# Patient Record
Sex: Female | Born: 1985 | Race: White | Hispanic: No | Marital: Married | Smoking: Never smoker
Health system: Southern US, Community
[De-identification: ages and names within clinical notes are randomized; demographics above are authoritative.]

## PROBLEM LIST (undated history)

## (undated) DIAGNOSIS — I10 Essential (primary) hypertension: Secondary | ICD-10-CM

---

## 2014-09-02 ENCOUNTER — Encounter (HOSPITAL_BASED_OUTPATIENT_CLINIC_OR_DEPARTMENT_OTHER): Payer: Self-pay | Admitting: Emergency Medicine

## 2014-09-02 ENCOUNTER — Emergency Department (HOSPITAL_BASED_OUTPATIENT_CLINIC_OR_DEPARTMENT_OTHER)
Admission: EM | Admit: 2014-09-02 | Discharge: 2014-09-02 | Disposition: A | Discharge status: Home / Self Care | Payer: BLUE CROSS/BLUE SHIELD | Attending: Emergency Medicine | Admitting: Emergency Medicine

## 2014-09-02 ENCOUNTER — Emergency Department (HOSPITAL_BASED_OUTPATIENT_CLINIC_OR_DEPARTMENT_OTHER): Payer: BLUE CROSS/BLUE SHIELD

## 2014-09-02 DIAGNOSIS — S51812A Laceration without foreign body of left forearm, initial encounter: Secondary | ICD-10-CM | POA: Insufficient documentation

## 2014-09-02 DIAGNOSIS — Y288XXA Contact with other sharp object, undetermined intent, initial encounter: Secondary | ICD-10-CM | POA: Diagnosis not present

## 2014-09-02 DIAGNOSIS — IMO0002 Reserved for concepts with insufficient information to code with codable children: Secondary | ICD-10-CM

## 2014-09-02 DIAGNOSIS — Z792 Long term (current) use of antibiotics: Secondary | ICD-10-CM | POA: Diagnosis not present

## 2014-09-02 DIAGNOSIS — I10 Essential (primary) hypertension: Secondary | ICD-10-CM | POA: Diagnosis not present

## 2014-09-02 DIAGNOSIS — Y929 Unspecified place or not applicable: Secondary | ICD-10-CM | POA: Diagnosis not present

## 2014-09-02 DIAGNOSIS — Y9389 Activity, other specified: Secondary | ICD-10-CM | POA: Diagnosis not present

## 2014-09-02 DIAGNOSIS — Z79899 Other long term (current) drug therapy: Secondary | ICD-10-CM | POA: Diagnosis not present

## 2014-09-02 DIAGNOSIS — Y998 Other external cause status: Secondary | ICD-10-CM | POA: Diagnosis not present

## 2014-09-02 HISTORY — DX: Essential (primary) hypertension: I10

## 2014-09-02 MED ORDER — LIDOCAINE-EPINEPHRINE 2 %-1:100000 IJ SOLN
INTRAMUSCULAR | Status: AC
  Administered 2014-09-02: 1 mL
  Filled 2014-09-02: qty 1

## 2014-09-02 MED ORDER — CEPHALEXIN 250 MG PO CAPS
500.0000 mg | ORAL_CAPSULE | Freq: Once | ORAL | Status: AC
  Administered 2014-09-02: 500 mg via ORAL
  Filled 2014-09-02: qty 2

## 2014-09-02 MED ORDER — CEPHALEXIN 500 MG PO CAPS
500.0000 mg | ORAL_CAPSULE | Freq: Four times a day (QID) | ORAL | Status: AC
Start: 2014-09-02 — End: ?

## 2014-09-02 NOTE — ED Notes (Signed)
Pt presents to ED with complaints of lac to left inner forearm. Pt reports she accidentally cut it on a table saw around 9 pm last night but had been drinking so she did not want to drive here.

## 2014-09-02 NOTE — Discharge Instructions (Signed)
Keep wound dry and do not remove dressing for 24 hours if possible. After that, wash gently morning and night (every 12 hours) with soap and water. Use a topical antibiotic ointment and cover with a bandaid or gauze.  °  °Do NOT use rubbing alcohol or hydrogen peroxide, do not soak the area °  °Present to your primary care doctor or the urgent care of your choice, or the ED for suture removal in 7-10 days. °  °Every attempt was made to remove foreign body (contaminants) from the wound.  However, there is always a chance that some may remain in the wound. This can  increase your risk of infection. °  °If you see signs of infection (warmth, redness, tenderness, pus, sharp increase in pain, fever, red streaking in the skin) immediately return to the emergency department. °  °After the wound heals fully, apply sunscreen for 6-12 months to minimize scarring.  ° °

## 2014-09-02 NOTE — ED Provider Notes (Signed)
CSN: [id number]     Arrival date & time 09/02/14  1227 History   First MD Initiated Contact with Patient 09/02/14 1348     Chief Complaint  Patient presents with  . Laceration     (Consider location/radiation/quality/duration/timing/severity/associated sxs/prior Treatment) HPI  Ana Poole is a 29 y.o. female complaining of laceration to left volar forearm onset at 9 PM last night. Patient says she was reaching over for a operating circular saw for a piece of wood accidentally cut herself. States her last tetanus shot was within the last 3 years. Patient states she does not present to the ED asked night because her and her husband had been drinking alcohol and they felt that it was unsafe to drive. She denies numbness, weakness, decreased range of motion. She states she is right-hand dominant.   Past Medical History  Diagnosis Date  . Hypertension    History reviewed. No pertinent past surgical history. No family history on file. History  Substance Use Topics  . Smoking status: Never Smoker   . Smokeless tobacco: Not on file  . Alcohol Use: Not on file   OB History    No data available     Review of Systems  10 systems reviewed and found to be negative, except as noted in the HPI.   Allergies  Sulfa antibiotics  Home Medications   Prior to Admission medications   Medication Sig Start Date End Date Taking? Authorizing Provider  lisinopril (PRINIVIL,ZESTRIL) 10 MG tablet Take 10 mg by mouth daily.   Yes Historical Provider, MD  cephALEXin (KEFLEX) 500 MG capsule Take 1 capsule (500 mg total) by mouth 4 (four) times daily. 09/02/14   Darrien Laakso, PA-C   BP 134/86 mmHg  Pulse 94  Temp(Src) 98.7 F (37.1 C) (Oral)  Resp 16  Ht 5\' 5"  (1.651 m)  Wt 155 lb (70.308 kg)  BMI 25.79 kg/m2  SpO2 100%  LMP 08/29/2014 Physical Exam  Musculoskeletal:       Arms: 7 cm full-thickness gaping laceration to left volar forearm. Bleeding is controlled, no gross  contamination, patient has full range of motion in flexion of every joint. Patient is intact to pinprick and light touch 5 digits.    ED Course  LACERATION REPAIR Date/Time: 09/02/2014 2:28 PM Performed by: Wynetta EmeryPISCIOTTA, Deyci Gesell Authorized by: Wynetta EmeryPISCIOTTA, Janesha Brissette Consent: Verbal consent obtained. Consent given by: patient Patient identity confirmed: verbally with patient Body area: upper extremity Location details: left lower arm Laceration length: 7 cm Foreign bodies: no foreign bodies Tendon involvement: none Nerve involvement: none Vascular damage: no Anesthesia: local infiltration Local anesthetic: lidocaine 2% with epinephrine Preparation: Patient was prepped and draped in the usual sterile fashion. Irrigation method: syringe Amount of cleaning: extensive Debridement: none Degree of undermining: none Skin closure: Ethilon (4-0) Number of sutures: 6 Technique: running Approximation: loose Approximation difficulty: simple Dressing: antibiotic ointment, non-adhesive packing strip and gauze roll Patient tolerance: Patient tolerated the procedure well with no immediate complications   (including critical care time) Labs Review Labs Reviewed - No data to display  Imaging Review Dg Forearm Left  09/02/2014   CLINICAL DATA:  Laceration from table saw injury.  EXAM: LEFT FOREARM - 2 VIEW  COMPARISON:  None.  FINDINGS: Frontal and lateral views were obtained. There is no demonstrable fracture or dislocation. Joint spaces appear intact. 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 There is no radiopaque foreign body.  IMPRESSION: No fracture or dislocation.  No  radiopaque foreign body.   Electronically Signed   By: Bretta Bang M.D.   On: 09/02/2014 14:29     EKG Interpretation None      MDM   Final diagnoses:  Laceration    Filed Vitals:   09/02/14 1238 09/02/14 1446  BP: 134/89 134/86  Pulse: 101 94  Temp: 98.7 F (37.1 C) 98.7 F  (37.1 C)  TempSrc: Oral Oral  Resp: 18 16  Height:  (1.651 m)   Weight: 155 lb (70.308 kg)   SpO2: 97% 100%    Medications  cephALEXin (KEFLEX) capsule 500 mg (500 mg Oral Given 09/02/14 1408)  lidocaine-EPINEPHrine (XYLOCAINE W/EPI) 2 %-1:100000 (with pres) injection (1 mL  Given by Other 09/02/14 1430)    Ana Poole is a pleasant 29 y.o. female presenting with gaping laceration to left forearm, there is no tendon or nerve damage. She is out of the 12 hour window for closure however she is young and healthy and this will leave a large scar. Wound is closed loosely, she started on antibiotics, we have had an extensive discussion of wound care and return precautions for infection. I've advised her that if she would like to have scar revision she can see a Engineer, petroleum after the wound fully [facility] in 6 months.  Evaluation does not show pathology that would require ongoing emergent intervention or inpatient treatment. Pt is hemodynamically stable and mentating appropriately. Discussed findings and plan with patient/guardian, who agrees with care plan. All questions answered. Return precautions discussed and outpatient follow up given.   Discharge Medication List as of 09/02/2014  2:35 PM    START taking these medications   Details  cephALEXin (KEFLEX) 500 MG capsule Take 1 capsule (500 mg total) by mouth 4 (four) times daily., Starting 09/02/2014, Until Discontinued, Print             Wynetta Emery, PA-C 09/02/14 1623  Hilario Quarry, MD 09/05/14 2150

## 2015-12-20 IMAGING — CR DG FOREARM 2V*L*
2 series · 2 of 2 positions shown · non-contrast
Comparison: None.

CLINICAL DATA: Laceration from table saw injury.

EXAM:
LEFT FOREARM - 2 VIEW

[x forearm ap left]
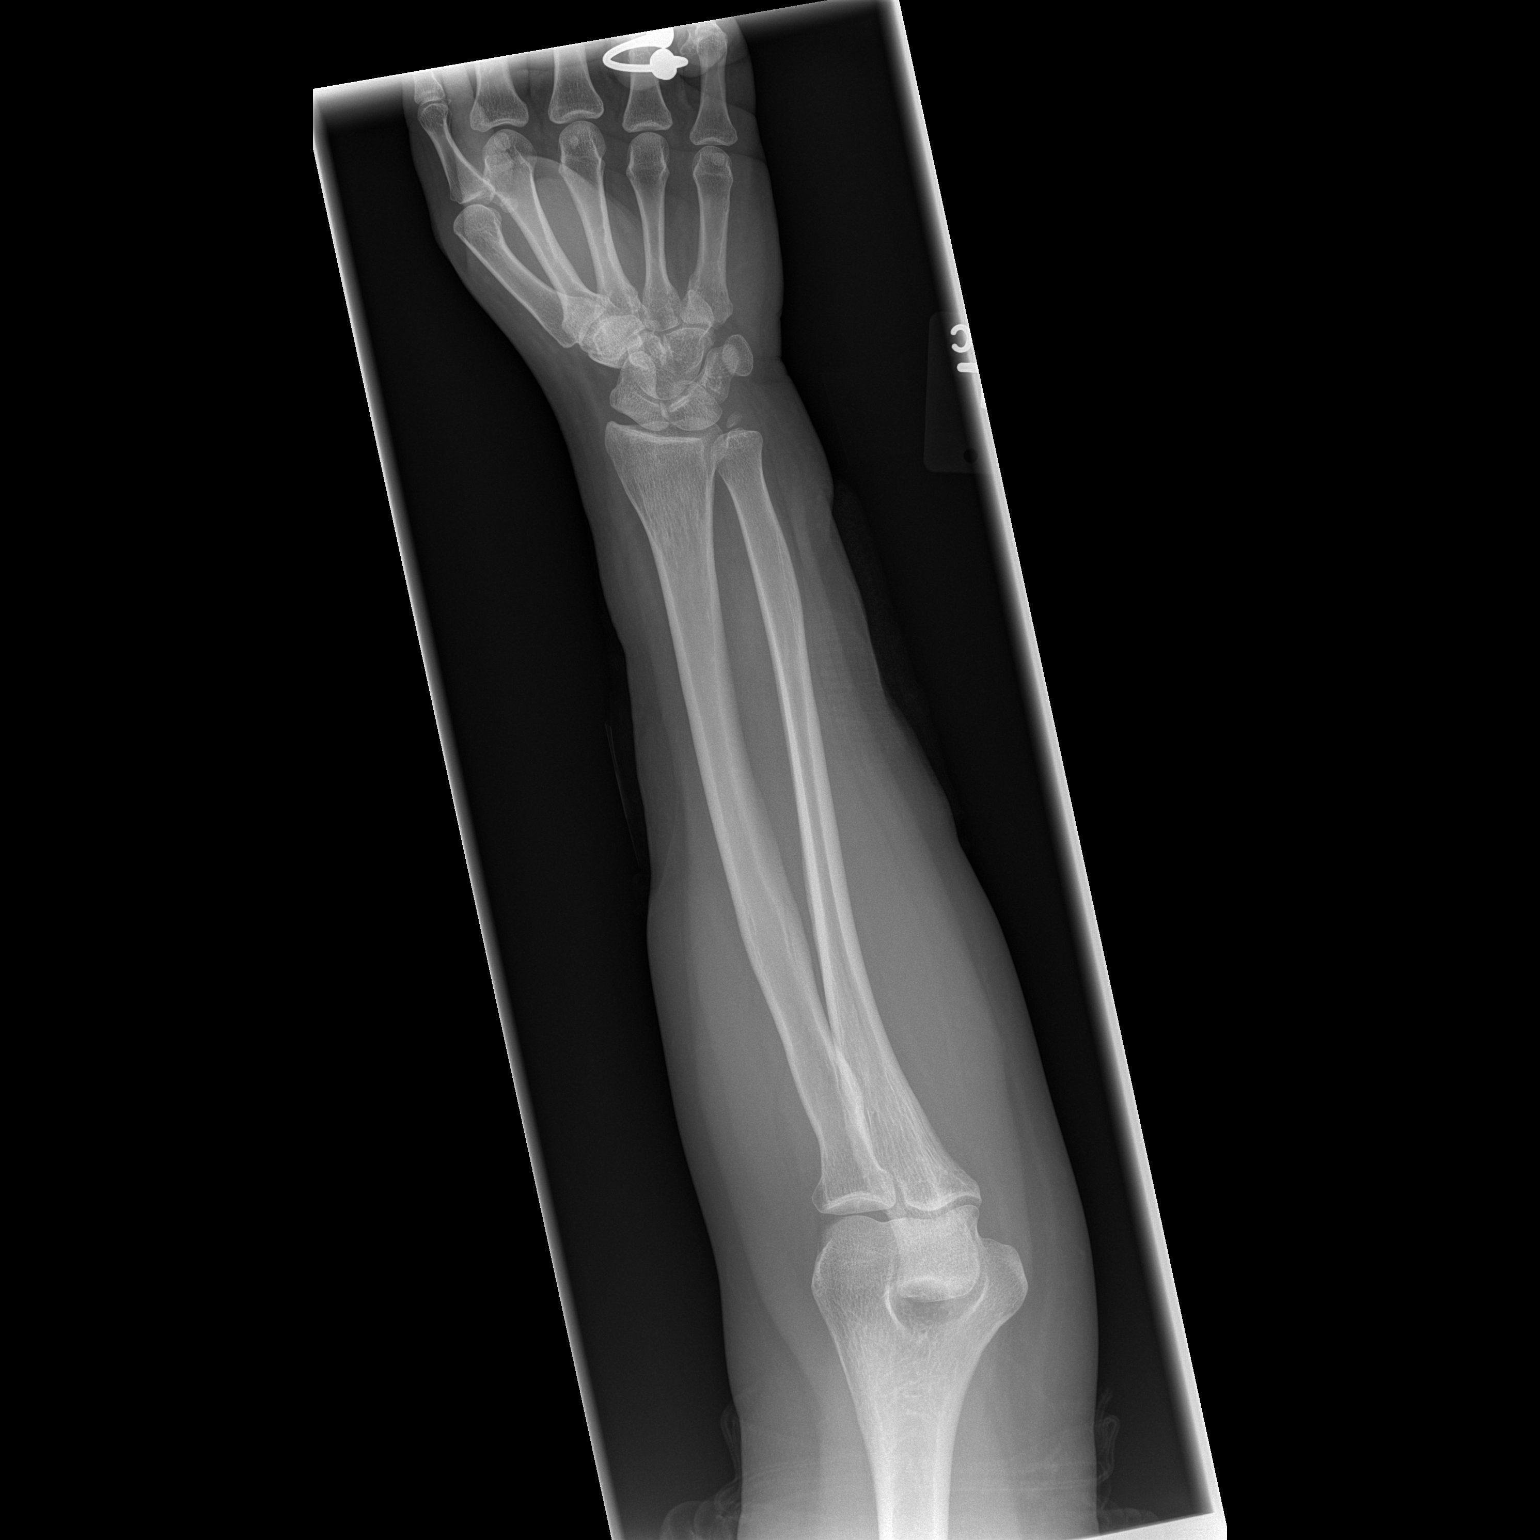

[x forearm lat left]
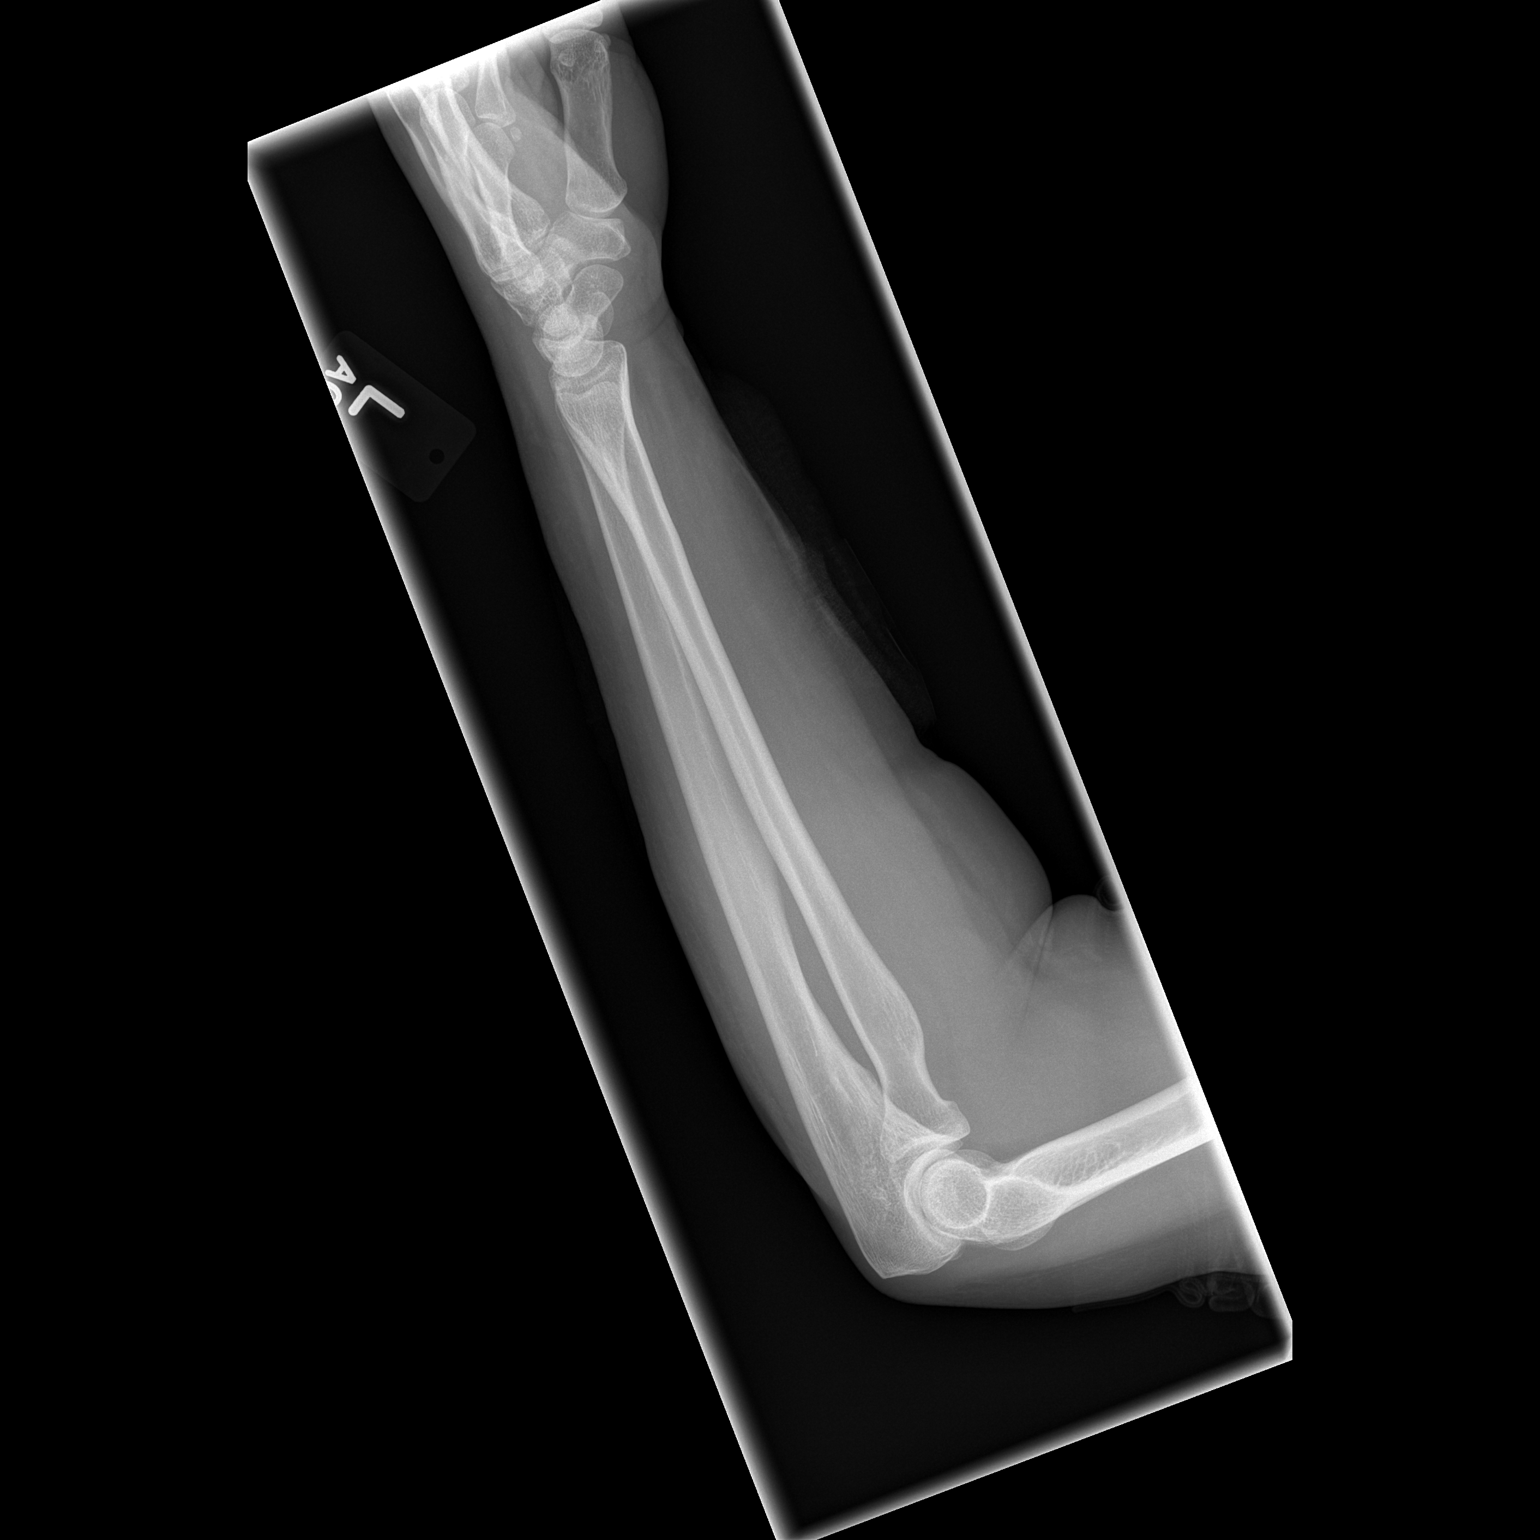

[2 of 2 positions shown; findings below may reference images not displayed]

FINDINGS: Frontal and lateral views were obtained. There is no demonstrable
fracture or dislocation. Joint spaces appear intact. There is
nonunion at the ulnar styloid, a finding that does not appear acute.
There is no radiopaque foreign body.
IMPRESSION: No fracture or dislocation.  No radiopaque foreign body.
